# Patient Record
Sex: Female | Born: 1970 | Race: Black or African American | Hispanic: No | Marital: Married | Smoking: Never smoker
Health system: Southern US, Community
[De-identification: ages and names within clinical notes are randomized; demographics above are authoritative.]

---

## 2012-12-24 ENCOUNTER — Encounter (HOSPITAL_COMMUNITY): Payer: Self-pay | Admitting: Emergency Medicine

## 2012-12-24 ENCOUNTER — Emergency Department (HOSPITAL_COMMUNITY)
Admission: EM | Admit: 2012-12-24 | Discharge: 2012-12-24 | Disposition: A | Discharge status: Home / Self Care | Payer: 59 | Attending: Emergency Medicine | Admitting: Emergency Medicine

## 2012-12-24 DIAGNOSIS — R1013 Epigastric pain: Secondary | ICD-10-CM | POA: Insufficient documentation

## 2012-12-24 DIAGNOSIS — K2990 Gastroduodenitis, unspecified, without bleeding: Secondary | ICD-10-CM | POA: Insufficient documentation

## 2012-12-24 DIAGNOSIS — K2970 Gastritis, unspecified, without bleeding: Secondary | ICD-10-CM | POA: Insufficient documentation

## 2012-12-24 LAB — POCT I-STAT TROPONIN I

## 2012-12-24 LAB — CBC
HCT: 37.3 % (ref 36.0–46.0)
Hemoglobin: 12.3 g/dL (ref 12.0–15.0)
MCV: 85.4 fL (ref 78.0–100.0)
Platelets: 240 10*3/uL (ref 150–400)
RBC: 4.37 MIL/uL (ref 3.87–5.11)
WBC: 6.6 10*3/uL (ref 4.0–10.5)

## 2012-12-24 LAB — HEPATIC FUNCTION PANEL
ALT: 9 U/L (ref 0–35)
AST: 13 U/L (ref 0–37)
Albumin: 3.4 g/dL — ABNORMAL LOW (ref 3.5–5.2)
Total Bilirubin: 0.2 mg/dL — ABNORMAL LOW (ref 0.3–1.2)

## 2012-12-24 LAB — BASIC METABOLIC PANEL
BUN: 12 mg/dL (ref 6–23)
CO2: 26 mEq/L (ref 19–32)
Chloride: 98 mEq/L (ref 96–112)
Creatinine, Ser: 0.77 mg/dL (ref 0.50–1.10)
Glucose, Bld: 103 mg/dL — ABNORMAL HIGH (ref 70–99)

## 2012-12-24 LAB — LIPASE, BLOOD: Lipase: 32 U/L (ref 11–59)

## 2012-12-24 MED ORDER — PANTOPRAZOLE SODIUM 40 MG PO TBEC
40.0000 mg | DELAYED_RELEASE_TABLET | Freq: Once | ORAL | Status: AC
  Administered 2012-12-24: 40 mg via ORAL
  Filled 2012-12-24: qty 1

## 2012-12-24 MED ORDER — PANTOPRAZOLE SODIUM 20 MG PO TBEC: 20.0000 mg | DELAYED_RELEASE_TABLET | Freq: Every day | ORAL | Status: AC

## 2012-12-24 MED ORDER — GI COCKTAIL ~~LOC~~
30.0000 mL | Freq: Once | ORAL | Status: AC
  Administered 2012-12-24: 30 mL via ORAL
  Filled 2012-12-24: qty 30

## 2012-12-24 NOTE — ED Provider Notes (Signed)
History     CSN: [id number]  Arrival date & time 12/24/12  1807   First MD Initiated Contact with Patient 12/24/12 1813      Chief Complaint  Patient presents with  . Chest Pain    (Consider location/radiation/quality/duration/timing/severity/associated sxs/prior treatment) HPI Pt with 1 month of intermittent epigastric pain after eating radiating up in to chest. Pt states this episode started today after eating. No nausea, vomiting, SOB, fever, chills, melena. No family hx of CAD, no DM, HTN. Pt is non-smoker. Denies NSAID use History reviewed. No pertinent past medical history.  History reviewed. No pertinent past surgical history.  No family history on file.  History  Substance Use Topics  . Smoking status: Never Smoker   . Smokeless tobacco: Not on file  . Alcohol Use: No    OB History   Grav Para Term Preterm Abortions TAB SAB Ect Mult Living   3 2   1   1         Review of Systems  Constitutional: Negative for fever and chills.  Respiratory: Negative for shortness of breath.   Cardiovascular: Positive for chest pain. Negative for palpitations and leg swelling.  Gastrointestinal: Positive for abdominal pain. Negative for nausea, vomiting, diarrhea, constipation, blood in stool and abdominal distention.  Musculoskeletal: Negative for back pain.  Skin: Negative for rash and wound.  All other systems reviewed and are negative.    Allergies  Review of patient's allergies indicates no known allergies.  Home Medications   Current Outpatient Rx  Name  Route  Sig  Dispense  Refill  . calcium elemental as carbonate (TUMS ULTRA 1000) 400 MG tablet   Oral   Chew 1,000 mg by mouth 3 (three) times daily as needed for heartburn.         . pantoprazole (PROTONIX) 20 MG tablet   Oral   Take 1 tablet (20 mg total) by mouth daily.   30 tablet   0     BP 125/77  Pulse 79  Temp(Src) 97.8 F (36.6 C) (Oral)  Resp 18  Ht 4\' 10"  (1.473 m)  Wt 187 lb (84.823 kg)   BMI 39.09 kg/m2  SpO2 98%  LMP 12/17/2012  Physical Exam  Nursing note and vitals reviewed. Constitutional: She is oriented to person, place, and time. She appears well-developed and well-nourished. No distress.  HENT:  Head: Normocephalic and atraumatic.  Mouth/Throat: Oropharynx is clear and moist.  Eyes: EOM are normal. Pupils are equal, round, and reactive to light.  Neck: Normal range of motion. Neck supple.  Cardiovascular: Normal rate and regular rhythm.   Pulmonary/Chest: Effort normal and breath sounds normal. No respiratory distress. She has no wheezes. She has no rales. She exhibits no tenderness.  Abdominal: Soft. Bowel sounds are normal. She exhibits no distension and no mass. There is tenderness (TTP over epigastrum). There is no rebound and no guarding.  Musculoskeletal: Normal range of motion. She exhibits no edema and no tenderness.  Neurological: She is alert and oriented to person, place, and time.  Skin: Skin is warm and dry. No rash noted. No erythema.  Psychiatric: She has a normal mood and affect. Her behavior is normal.    ED Course  Procedures (including critical care time)  Labs Reviewed  BASIC METABOLIC PANEL - Abnormal; Notable for the following:    Sodium 133 (*)    Glucose, Bld 103 (*)    All other components within normal limits  HEPATIC FUNCTION PANEL - Abnormal; Notable  for the following:    Albumin 3.4 (*)    Total Bilirubin 0.2 (*)    All other components within normal limits  CBC  LIPASE, BLOOD  TROPONIN I  POCT I-STAT TROPONIN I   No results found.   1. Gastritis       Date: 12/24/2012  Rate: 94  Rhythm: normal sinus rhythm  QRS Axis: normal  Intervals: normal  ST/T Wave abnormalities: normal  Conduction Disutrbances:none  Narrative Interpretation:   Old EKG Reviewed: none available   MDM  Likely GI-related. Will check basic labs and trop.   Pt symptoms improved. D/c home. Will start PPI and give Gi f/u      Loren Racer, MD 12/25/12 1530

## 2012-12-24 NOTE — ED Notes (Signed)
Pt here via POV for c/o chest pain that happens after she eats denies n/v sob,and diaphoresis

## 2012-12-24 NOTE — Discharge Instructions (Signed)
Avoid ibuprofen, naproxen and all NSAID's. Follow up with the gastroenterologist for persistent pain. Return to the emergency department for fever, worsening pain, vomiting blood or for any concerns  Gastritis, Adult Gastritis is soreness and swelling (inflammation) of the lining of the stomach. Gastritis can develop as a sudden onset (acute) or long-term (chronic) condition. If gastritis is not treated, it can lead to stomach bleeding and ulcers. CAUSES  Gastritis occurs when the stomach lining is weak or damaged. Digestive juices from the stomach then inflame the weakened stomach lining. The stomach lining may be weak or damaged due to viral or bacterial infections. One common bacterial infection is the Helicobacter pylori infection. Gastritis can also result from excessive alcohol consumption, taking certain medicines, or having too much acid in the stomach.  SYMPTOMS  In some cases, there are no symptoms. When symptoms are present, they may include:  Pain or a burning sensation in the upper abdomen.  Nausea.  Vomiting.  An uncomfortable feeling of fullness after eating. DIAGNOSIS  Your caregiver may suspect you have gastritis based on your symptoms and a physical exam. To determine the cause of your gastritis, your caregiver may perform the following:  Blood or stool tests to check for the H pylori bacterium.  Gastroscopy. A thin, flexible tube (endoscope) is passed down the esophagus and into the stomach. The endoscope has a light and camera on the end. Your caregiver uses the endoscope to view the inside of the stomach.  Taking a tissue sample (biopsy) from the stomach to examine under a microscope. TREATMENT  Depending on the cause of your gastritis, medicines may be prescribed. If you have a bacterial infection, such as an H pylori infection, antibiotics may be given. If your gastritis is caused by too much acid in the stomach, H2 blockers or antacids may be given. Your caregiver may  recommend that you stop taking aspirin, ibuprofen, or other nonsteroidal anti-inflammatory drugs (NSAIDs). HOME CARE INSTRUCTIONS  Only take over-the-counter or prescription medicines as directed by your caregiver.  If you were given antibiotic medicines, take them as directed. Finish them even if you start to feel better.  Drink enough fluids to keep your urine clear or pale yellow.  Avoid foods and drinks that make your symptoms worse, such as:  Caffeine or alcoholic drinks.  Chocolate.  Peppermint or mint flavorings.  Garlic and onions.  Spicy foods.  Citrus fruits, such as oranges, lemons, or limes.  Tomato-based foods such as sauce, chili, salsa, and pizza.  Fried and fatty foods.  Eat small, frequent meals instead of large meals. SEEK IMMEDIATE MEDICAL CARE IF:   You have black or dark red stools.  You vomit blood or material that looks like coffee grounds.  You are unable to keep fluids down.  Your abdominal pain gets worse.  You have a fever.  You do not feel better after 1 week.  You have any other questions or concerns. MAKE SURE YOU:  Understand these instructions.  Will watch your condition.  Will get help right away if you are not doing well or get worse. Document Released: 09/21/2001 Document Revised: 03/28/2012 Document Reviewed: 11/10/2011 [facility] Patient Information 2013 [place], [place].

## 2014-08-12 ENCOUNTER — Encounter (HOSPITAL_COMMUNITY): Payer: Self-pay | Admitting: Emergency Medicine

## 2020-01-04 ENCOUNTER — Other Ambulatory Visit: Payer: Self-pay

## 2020-01-04 ENCOUNTER — Emergency Department (HOSPITAL_COMMUNITY): Payer: Self-pay

## 2020-01-04 ENCOUNTER — Encounter (HOSPITAL_COMMUNITY): Payer: Self-pay | Admitting: Pediatrics

## 2020-01-04 ENCOUNTER — Emergency Department (HOSPITAL_COMMUNITY)
Admission: EM | Admit: 2020-01-04 | Discharge: 2020-01-04 | Disposition: A | Discharge status: Home / Self Care | Payer: Self-pay | Attending: Emergency Medicine | Admitting: Emergency Medicine

## 2020-01-04 DIAGNOSIS — M79605 Pain in left leg: Secondary | ICD-10-CM | POA: Insufficient documentation

## 2020-01-04 DIAGNOSIS — Z79899 Other long term (current) drug therapy: Secondary | ICD-10-CM | POA: Insufficient documentation

## 2020-01-04 LAB — POC URINE PREG, ED: Preg Test, Ur: NEGATIVE

## 2020-01-04 MED ORDER — NAPROXEN 375 MG PO TABS: 375.0000 mg | ORAL_TABLET | Freq: Two times a day (BID) | ORAL | 0 refills | Status: AC

## 2020-01-04 MED ORDER — KETOROLAC TROMETHAMINE 15 MG/ML IJ SOLN
15.0000 mg | Freq: Once | INTRAMUSCULAR | Status: AC
  Administered 2020-01-04: 14:00:00 15 mg via INTRAMUSCULAR
  Filled 2020-01-04: qty 1

## 2020-01-04 MED ORDER — PREDNISONE 10 MG PO TABS: 20.0000 mg | ORAL_TABLET | Freq: Every day | ORAL | 0 refills | Status: AC

## 2020-01-04 MED ORDER — PREDNISONE 20 MG PO TABS
60.0000 mg | ORAL_TABLET | Freq: Once | ORAL | Status: AC
  Administered 2020-01-04: 60 mg via ORAL
  Filled 2020-01-04: qty 3

## 2020-01-04 NOTE — ED Triage Notes (Signed)
Patient c/o left leg pain; stated lasting for about 3 weeks now and unable to resolve with any home remedies; denies prior injury or trauma

## 2020-01-04 NOTE — ED Notes (Signed)
Pt transported to xray 

## 2020-01-04 NOTE — ED Notes (Signed)
Patient verbalizes understanding of discharge instructions. Opportunity for questioning and answers were provided. Pt discharged from ED. 

## 2020-01-04 NOTE — ED Provider Notes (Signed)
[facility] EMERGENCY DEPARTMENT Provider Note   CSN: [id number] Arrival date & time: 01/04/20  1222     History Chief Complaint  Patient presents with  . Leg Pain    Lori Goodman is a 49 y.o. female with no known past medical history presents emergency room today with chief complaint of intermittent left leg pain x3 weeks. Patient describes the pain as an aching sensation. Pain is located in her knee and radiates down her leg. She rates the pain 6 out of 10 in severity. She states she has no pain when she wakes up in the morning but the pain progressively worsens while she is at work. Her leg will feel weak at the end of the workday. She works at [employer] and does a lot of walking around and lifting. She has been trying over-the-counter creams well as aspirin without any change in symptoms. She denies any fall or injury to the leg.   Denies exogenous estrogen use, lower extremity pain or swelling, recent travel or immobilization, history of PE or DVT, family or personal history of bleeding or clotting disorders, cough or hemoptysis. Also denies fever, chills, numbness, tingling.     History reviewed. No pertinent past medical history.  There are no problems to display for this patient.   History reviewed. No pertinent surgical history.   OB History    Gravida  3   Para  2   Term      Preterm      AB  1   Living        SAB      TAB      Ectopic  1   Multiple      Live Births              No family history on file.  Social History   Tobacco Use  . Smoking status: Never Smoker  Substance Use Topics  . Alcohol use: No  . Drug use: No    Home Medications Prior to Admission medications   Medication Sig Start Date End Date Taking? Authorizing Provider  calcium elemental as carbonate (TUMS ULTRA 1000) 400 MG tablet Chew 1,000 mg by mouth 3 (three) times daily as needed for heartburn.    [provider]  naproxen  (NAPROSYN) 375 MG tablet Take 1 tablet (375 mg total) by mouth 2 (two) times daily for 5 days. 01/05/20 01/10/20  Tyliyah Mcmeekin E, PA-C  pantoprazole (PROTONIX) 20 MG tablet Take 1 tablet (20 mg total) by mouth daily. 12/24/12   Julianne Rice, MD  predniSONE (DELTASONE) 10 MG tablet Take 2 tablets (20 mg total) by mouth daily for 4 days. 01/05/20 01/09/20  Jaimon Bugaj, Harley Hallmark, PA-C    Allergies    Patient has no known allergies.  Review of Systems   Review of Systems  All other systems are reviewed and are negative for acute change except as noted in the HPI.   Physical Exam Updated Vital Signs BP 122/60   Pulse 77   Temp 98.3 F (36.8 C) (Oral)   Resp 16   Ht 4\' 10"  (1.473 m)   Wt 95.3 kg   SpO2 100%   BMI 43.89 kg/m   Physical Exam Vitals and nursing note reviewed.  Constitutional:      Appearance: She is well-developed. She is not ill-appearing or toxic-appearing.  HENT:     Head: Normocephalic and atraumatic.     Nose: Nose normal.  Eyes:  General: No scleral icterus.       Right eye: No discharge.        Left eye: No discharge.     Conjunctiva/sclera: Conjunctivae normal.  Neck:     Vascular: No JVD.  Cardiovascular:     Rate and Rhythm: Normal rate and regular rhythm.     Pulses: Normal pulses.     Heart sounds: Normal heart sounds.  Pulmonary:     Effort: Pulmonary effort is normal.     Breath sounds: Normal breath sounds.  Abdominal:     General: There is no distension.  Musculoskeletal:        General: Normal range of motion.     Cervical back: Normal range of motion.     Comments: Pelvis is stable. Ambulates with mildly antalgic gait.  Full ROM of left hip and knee. Bony tenderness to palpation of patella. No calf swelling, negative homan's sign. Full ROM of left ankle. Neurovascularly intact distally. Compartments soft above and below affected joint. Able to wiggle all toes.  Skin:    General: Skin is warm and dry.  Neurological:     Mental  Status: She is oriented to person, place, and time.     GCS: GCS eye subscore is 4. GCS verbal subscore is 5. GCS motor subscore is 6.     Comments: Fluent speech, no facial droop.  Sensation grossly intact to light touch in the lower extremities bilaterally. No saddle anesthesias. Strength 5/5 with flexion and extension at the bilateral hips, knees, and ankles. Coordination intact with heel to shin testing.   Psychiatric:        Behavior: Behavior normal.     ED Results / Procedures / Treatments   Labs (all labs ordered are listed, but only abnormal results are displayed) Labs Reviewed  POC URINE PREG, ED    EKG None  Radiology DG Knee Complete 4 Views Left  Result Date: 01/04/2020 CLINICAL DATA:  Left knee pain for 3 weeks.  No trauma. EXAM: LEFT KNEE - COMPLETE 4+ VIEW COMPARISON:  None. FINDINGS: No evidence of fracture, dislocation, or joint effusion. No evidence of arthropathy or other focal bone abnormality. Soft tissues are unremarkable. IMPRESSION: Negative. Electronically Signed   By: Sherian Rein M.D.   On: 01/04/2020 13:24    Procedures Procedures (including critical care time)  Medications Ordered in ED Medications  predniSONE (DELTASONE) tablet 60 mg (has no administration in time range)  ketorolac (TORADOL) 15 MG/ML injection 15 mg (15 mg Intramuscular Given 01/04/20 1402)    ED Course  I have reviewed the triage vital signs and the nursing notes.  Pertinent labs & imaging results that were available during my care of the patient were reviewed by me and considered in my medical decision making (see chart for details).    MDM Rules/Calculators/A&P                      Patient presents to the ED with complaints of pain to the left knee without known injury . Exam without obvious deformity or open wounds. ROM intact. Tender to palpation over patella. No joint line tenderness. NVI distally. Xray negative for fracture/dislocation. Will give IM Toradol for pain  as well as p.o. prednisone and discharge patient with prescription for short burst of prednisone and naproxen for symptomatic care. Based on her exam I have low suspicion for septic joint or DVT. She still having irregular periods that pregnancy test was obtained and is  negative. I discussed results, treatment plan, need for follow-up with sports medicine for further evaluation of this pain, and return precautions with the patient. Provided opportunity for questions, patient confirmed understanding and are in agreement with plan.    Portions of this note were generated with Scientist, clinical (histocompatibility and immunogenetics). Dictation errors may occur despite best attempts at proofreading.   Final Clinical Impression(s) / ED Diagnoses Final diagnoses:  Left leg pain    Rx / DC Orders ED Discharge Orders         Ordered    predniSONE (DELTASONE) 10 MG tablet  Daily     01/04/20 1352    naproxen (NAPROSYN) 375 MG tablet  2 times daily     01/04/20 1352           Sherene Sires, PA-C 01/04/20 1404    Tegeler, Canary Brim, MD 01/04/20 1558

## 2020-01-04 NOTE — Discharge Instructions (Addendum)
You have been seen today for left leg pain. Please read and follow all provided instructions. Return to the emergency room for worsening condition or new concerning symptoms.    1. Medications:  -Prescription sent to your pharmacy for naproxen. This is an anti-inflammatory medicine. Please take as prescribed. You take it with food so it does not cause upset stomach. Do not take any additional ibuprofen, Aleve, Motrin, Advil, Goody powder at these medicines are similar. -Prescription sent to your pharmacy for prednisone. This is a steroid used to help inflammation and pain. Please take as prescribed. -You can still take Tylenol as needed for pain.  Continue usual home medications Take medications as prescribed. Please review all of the medicines and only take them if you do not have an allergy to them.   2. Treatment: rest. You can try buying a compression sleeve to wear on your knee while working.  3. Follow Up:  Please follow up with primary care provider by scheduling an appointment as soon as possible for a visit  If you do not have a primary care physician, contact [facility] at [phone] for referral  -Also recommend you follow-up with a sports medicine doctor. I have given you the information for Dr. Dion Saucier. Call his office and schedule the next available appointment. When you call please mention this is an emergency department follow-up visit.  It is also a possibility that you have an allergic reaction to any of the medicines that you have been prescribed - Everybody reacts differently to medications and while MOST people have no trouble with most medicines, you may have a reaction such as nausea, vomiting, rash, swelling, shortness of breath. If this is the case, please stop taking the medicine immediately and contact your physician.  ?

## 2020-10-24 IMAGING — DX DG KNEE COMPLETE 4+V*L*
4 series · 4 of 4 positions shown · non-contrast
Comparison: None.

CLINICAL DATA: Left knee pain for 3 weeks.  No trauma.

EXAM:
LEFT KNEE - COMPLETE 4+ VIEW

[knee ap]
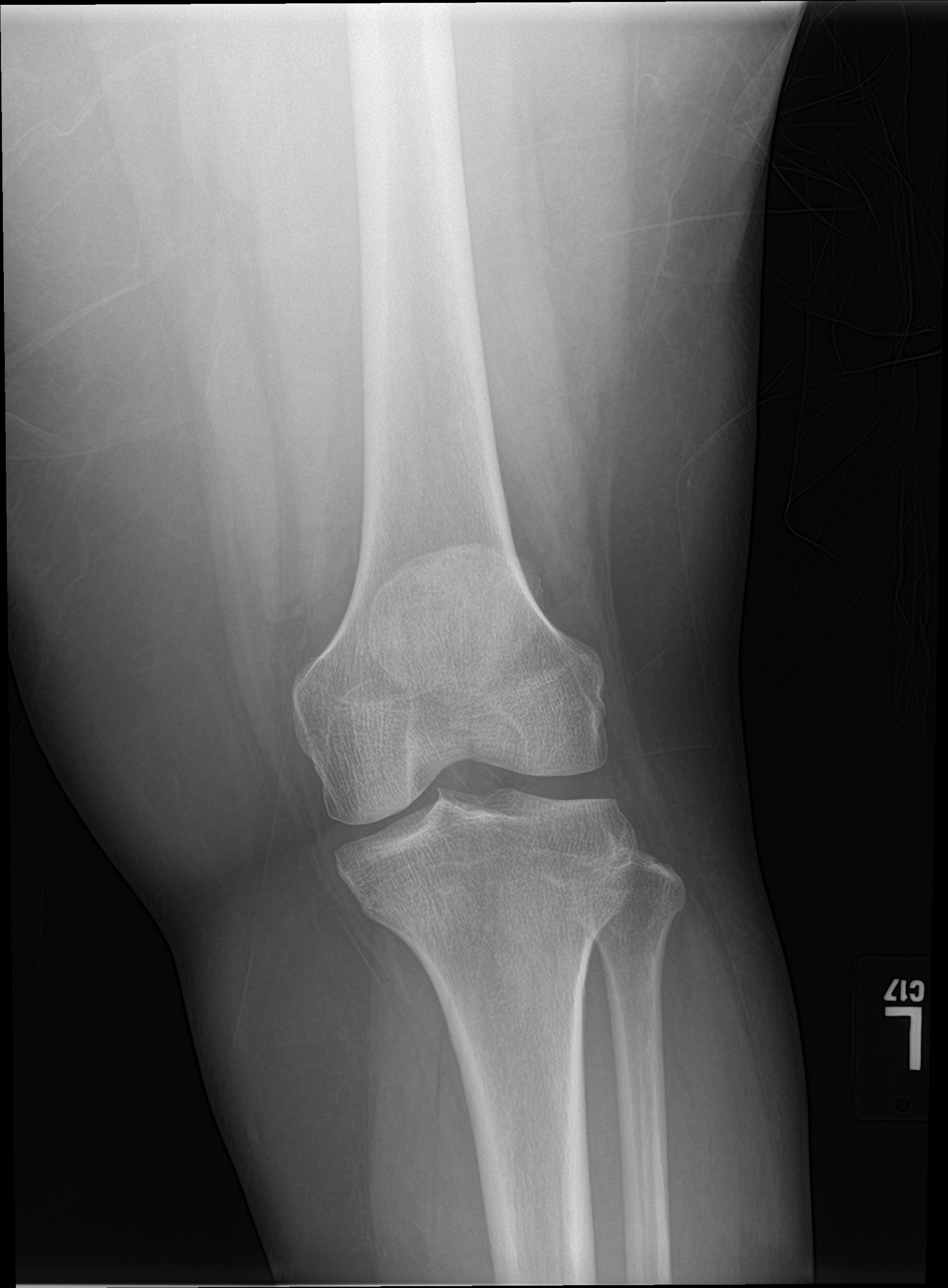

[knee lat]
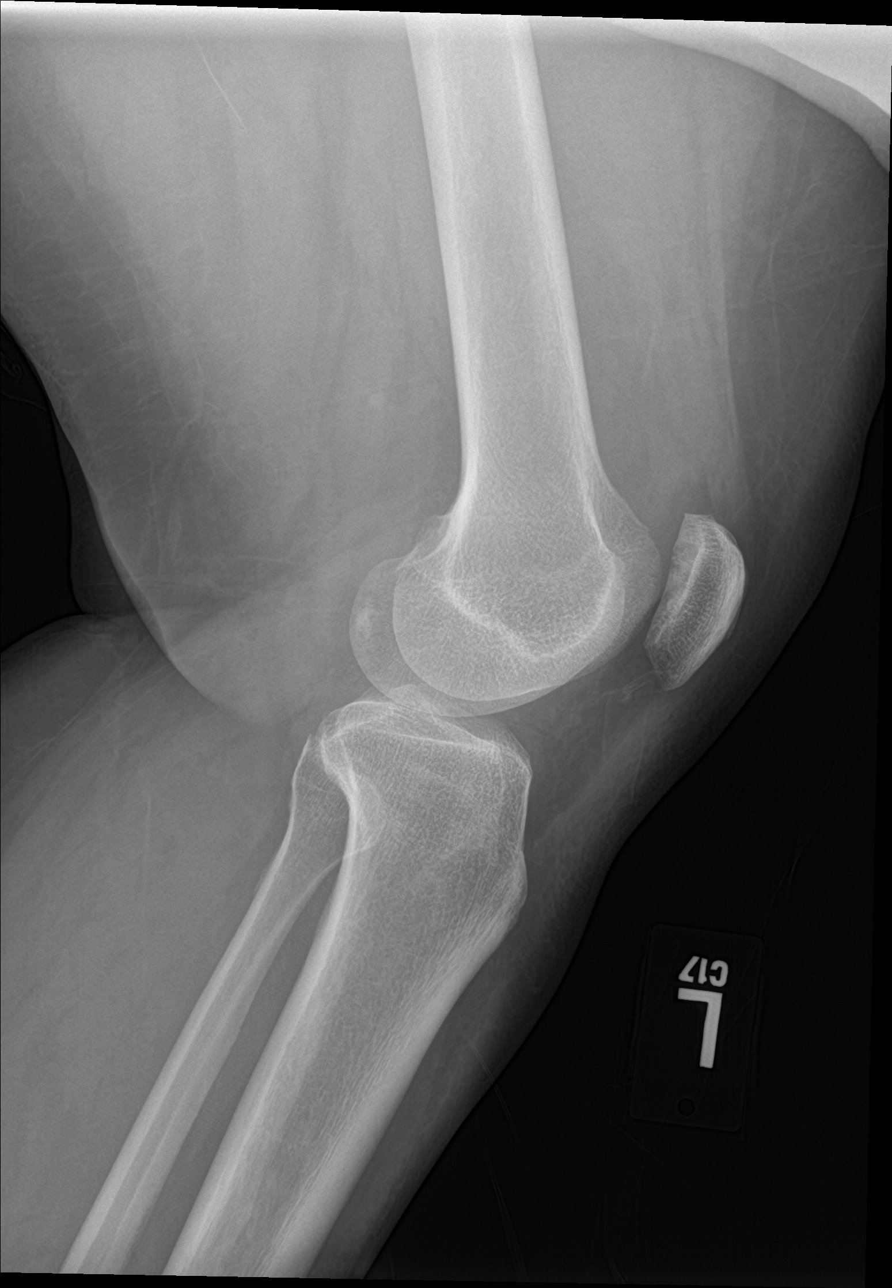

[knee obl (1 of 2)]
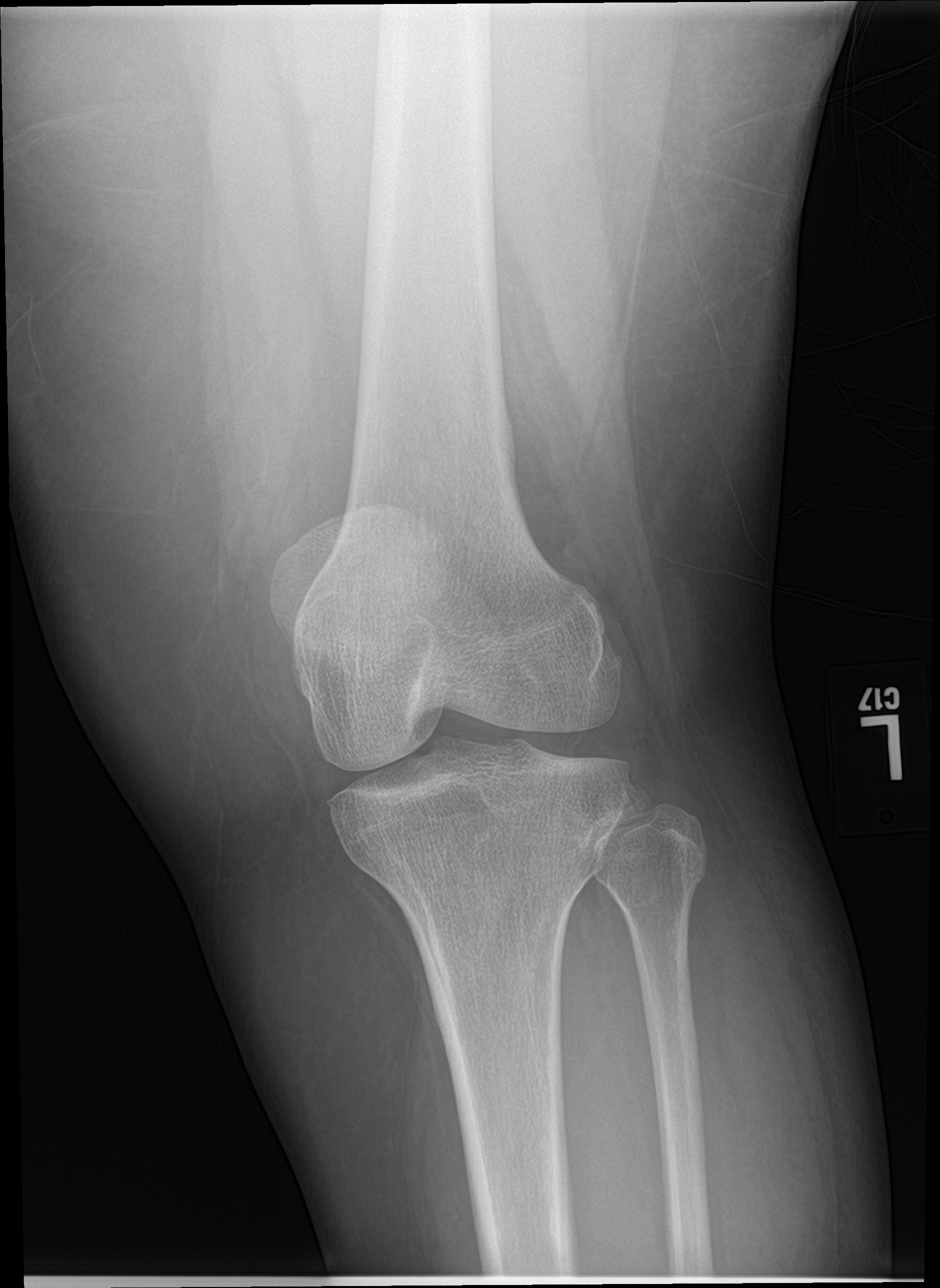

[knee obl (2 of 2)]
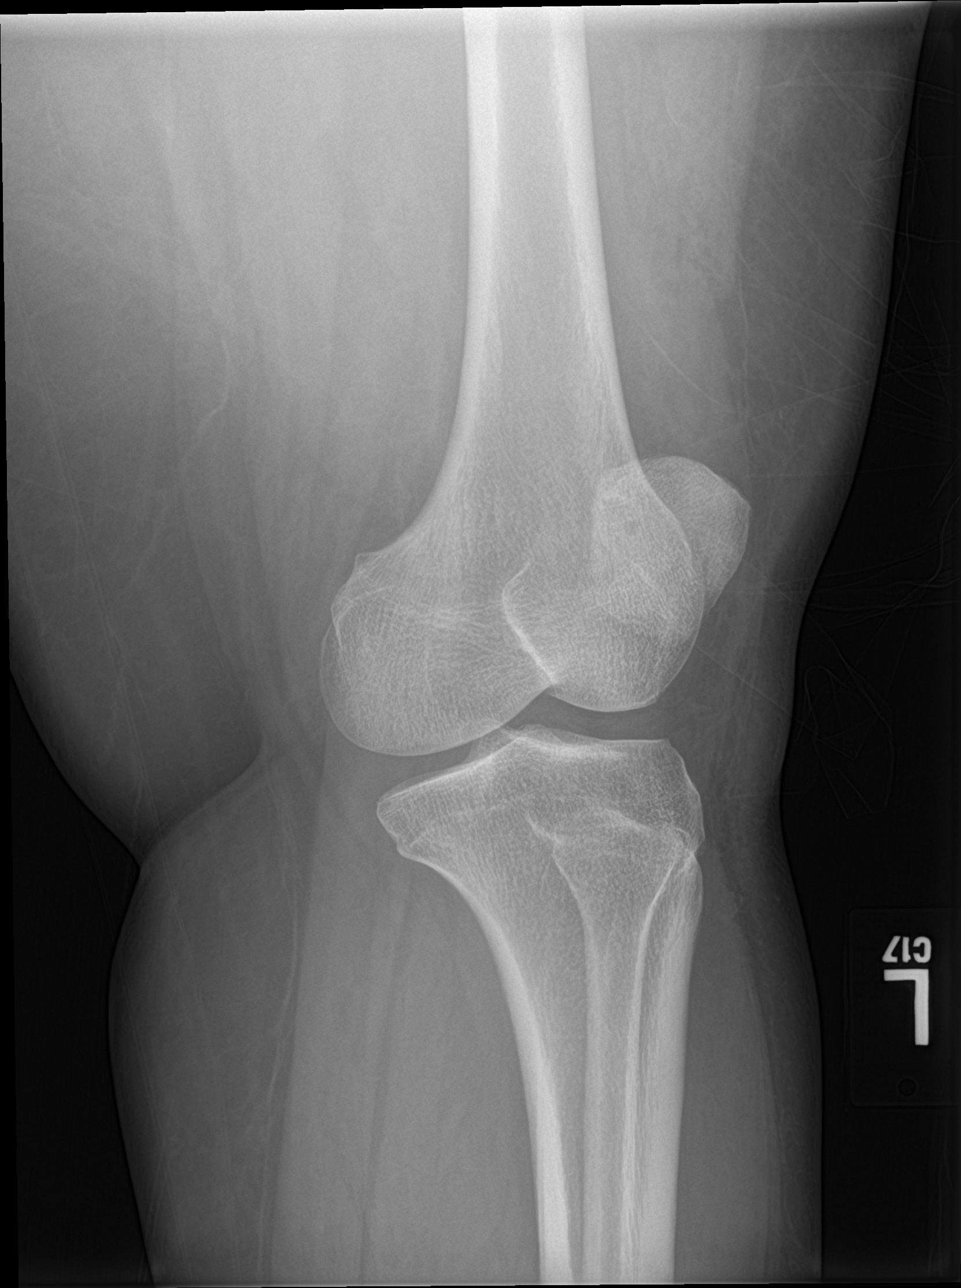

[4 of 4 positions shown; findings below may reference images not displayed]

FINDINGS: No evidence of fracture, dislocation, or joint effusion. No evidence
of arthropathy or other focal bone abnormality. Soft tissues are
unremarkable.
IMPRESSION: Negative.
